# Patient Record
Sex: Male | Born: 1995 | Race: Black or African American | Hispanic: No | Marital: Single | Smoking: Never smoker
Health system: Southern US, Community
[De-identification: ages and names within clinical notes are randomized; demographics above are authoritative.]

## PROBLEM LIST (undated history)

## (undated) HISTORY — PX: ABDOMINAL SURGERY: SHX537

---
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
## 2015-07-22 ENCOUNTER — Encounter (HOSPITAL_BASED_OUTPATIENT_CLINIC_OR_DEPARTMENT_OTHER): Payer: Self-pay

## 2015-07-22 ENCOUNTER — Emergency Department (HOSPITAL_BASED_OUTPATIENT_CLINIC_OR_DEPARTMENT_OTHER)
Admission: EM | Admit: 2015-07-22 | Discharge: 2015-07-22 | Disposition: A | Discharge status: Home / Self Care | Payer: BLUE CROSS/BLUE SHIELD | Attending: Emergency Medicine | Admitting: Emergency Medicine

## 2015-07-22 DIAGNOSIS — R509 Fever, unspecified: Secondary | ICD-10-CM | POA: Diagnosis present

## 2015-07-22 DIAGNOSIS — J069 Acute upper respiratory infection, unspecified: Secondary | ICD-10-CM | POA: Diagnosis not present

## 2015-07-22 LAB — RAPID STREP SCREEN (MED CTR MEBANE ONLY): Streptococcus, Group A Screen (Direct): NEGATIVE

## 2015-07-22 MED ORDER — IBUPROFEN 600 MG PO TABS: 600.0000 mg | ORAL_TABLET | Freq: Four times a day (QID) | ORAL | Status: AC | PRN

## 2015-07-22 MED ORDER — ACETAMINOPHEN 325 MG PO TABS
650.0000 mg | ORAL_TABLET | Freq: Once | ORAL | Status: AC | PRN
  Administered 2015-07-22: 650 mg via ORAL
  Filled 2015-07-22: qty 2

## 2015-07-22 MED ORDER — KETOROLAC TROMETHAMINE 30 MG/ML IJ SOLN
30.0000 mg | Freq: Once | INTRAMUSCULAR | Status: AC
  Administered 2015-07-22: 30 mg via INTRAMUSCULAR
  Filled 2015-07-22: qty 1

## 2015-07-22 NOTE — ED Notes (Signed)
Pt reports headache, sore throat, back pain and fever that started today.

## 2015-07-22 NOTE — Discharge Instructions (Signed)
Upper Respiratory Infection, Adult Most upper respiratory infections (URIs) are a viral infection of the air passages leading to the lungs. A URI affects the nose, throat, and upper air passages. The most common type of URI is nasopharyngitis and is typically referred to as "the common cold." URIs run their course and usually go away on their own. Most of the time, a URI does not require medical attention, but sometimes a bacterial infection in the upper airways can follow a viral infection. This is called a secondary infection. Sinus and middle ear infections are common types of secondary upper respiratory infections. Bacterial pneumonia can also complicate a URI. A URI can worsen asthma and chronic obstructive pulmonary disease (COPD). Sometimes, these complications can require emergency medical care and may be life threatening.  CAUSES Almost all URIs are caused by viruses. A virus is a type of germ and can spread from one person to another.  RISKS FACTORS You may be at risk for a URI if:   You smoke.   You have chronic heart or lung disease.  You have a weakened defense (immune) system.   You are very young or very old.   You have nasal allergies or asthma.  You work in crowded or poorly ventilated areas.  You work in health care facilities or schools. SIGNS AND SYMPTOMS  Symptoms typically develop 2-3 days after you come in contact with a cold virus. Most viral URIs last 7-10 days. However, viral URIs from the influenza virus (flu virus) can last 14-18 days and are typically more severe. Symptoms may include:   Runny or stuffy (congested) nose.   Sneezing.   Cough.   Sore throat.   Headache.   Fatigue.   Fever.   Loss of appetite.   Pain in your forehead, behind your eyes, and over your cheekbones (sinus pain).  Muscle aches.  DIAGNOSIS  Your health care provider may diagnose a URI by:  Physical exam.  Tests to check that your symptoms are not due to  another condition such as:  Strep throat.  Sinusitis.  Pneumonia.  Asthma. TREATMENT  A URI goes away on its own with time. It cannot be cured with medicines, but medicines may be prescribed or recommended to relieve symptoms. Medicines may help:  Reduce your fever.  Reduce your cough.  Relieve nasal congestion. HOME CARE INSTRUCTIONS   Take medicines only as directed by your health care provider.   Gargle warm saltwater or take cough drops to comfort your throat as directed by your health care provider.  Use a warm mist humidifier or inhale steam from a shower to increase air moisture. This may make it easier to breathe.  Drink enough fluid to keep your urine clear or pale yellow.   Eat soups and other clear broths and maintain good nutrition.   Rest as needed.   Return to work when your temperature has returned to normal or as your health care provider advises. You may need to stay home longer to avoid infecting others. You can also use a face mask and careful hand washing to prevent spread of the virus.  Increase the usage of your inhaler if you have asthma.   Do not use any tobacco products, including cigarettes, chewing tobacco, or electronic cigarettes. If you need help quitting, ask your health care provider. PREVENTION  The best way to protect yourself from getting a cold is to practice good hygiene.   Avoid oral or hand contact with people with cold   symptoms.   Wash your hands often if contact occurs.  There is no clear evidence that vitamin C, vitamin E, echinacea, or exercise reduces the chance of developing a cold. However, it is always recommended to get plenty of rest, exercise, and practice good nutrition.  SEEK MEDICAL CARE IF:   You are getting worse rather than better.   Your symptoms are not controlled by medicine.   You have chills.  You have worsening shortness of breath.  You have brown or red mucus.  You have yellow or brown nasal  discharge.  You have pain in your face, especially when you bend forward.  You have a fever.  You have swollen neck glands.  You have pain while swallowing.  You have white areas in the back of your throat. SEEK IMMEDIATE MEDICAL CARE IF:   You have severe or persistent:  Headache.  Ear pain.  Sinus pain.  Chest pain.  You have chronic lung disease and any of the following:  Wheezing.  Prolonged cough.  Coughing up blood.  A change in your usual mucus.  You have a stiff neck.  You have changes in your:  Vision.  Hearing.  Thinking.  Mood. MAKE SURE YOU:   Understand these instructions.  Will watch your condition.  Will get help right away if you are not doing well or get worse.   This information is not intended to replace advice given to you by your health care provider. Make sure you discuss any questions you have with your health care provider.   Document Released: 08/20/2000 Document Revised: 07/11/2014 Document Reviewed: 06/01/2013 Elsevier Interactive Patient Education 2016 [employer]  

## 2015-07-22 NOTE — ED Provider Notes (Signed)
CSN: [id number]     Arrival date & time 07/22/15  0101 History   First MD Initiated Contact with Patient 07/22/15 0116     Chief Complaint  Patient presents with  . Fever     (Consider location/radiation/quality/duration/timing/severity/associated sxs/prior Treatment) HPI  This a 20 year old male with no significant past medical history who presents with sore throat, fever, myalgias. Patient states onset of symptoms earlier today. Fever up to 101.7. Did not improve with Tylenol or Motrin but his grandmother states that she is not sure that she gave him enough. Denies any nausea, vomiting or shortness of breath, diarrhea. Does endorse a dry cough. Just recently graduated from college and has been traveling and dizzy interviewing. Multiple sick contacts. Endorses headache. No neck pain or stiffness.  History reviewed. No pertinent past medical history. Past Surgical History  Procedure Laterality Date  . Abdominal surgery     No family history on file. Social History  Substance Use Topics  . Smoking status: Never Smoker   . Smokeless tobacco: None  . Alcohol Use: Yes    Review of Systems  Constitutional: Positive for fever.  HENT: Positive for sore throat. Negative for trouble swallowing.   Respiratory: Positive for cough. Negative for shortness of breath.   Cardiovascular: Negative for chest pain.  Gastrointestinal: Negative for nausea, vomiting and abdominal pain.  Musculoskeletal: Negative for neck pain and neck stiffness.  Skin: Negative for rash.  All other systems reviewed and are negative.     Allergies  Latex  Home Medications   Prior to Admission medications   Medication Sig Start Date End Date Taking? Authorizing Provider  ibuprofen (ADVIL,MOTRIN) 600 MG tablet Take 1 tablet (600 mg total) by mouth every 6 (six) hours as needed. 07/22/15   Shon Batonourtney F Clarita Mcelvain, MD   BP 118/69 mmHg  Pulse 97  Temp(Src) 101.6 F (38.7 C) (Oral)  Resp 18  Ht 6' (1.829 m)  Wt 215  lb (97.523 kg)  BMI 29.15 kg/m2  SpO2 97% Physical Exam  Constitutional: He is oriented to person, place, and time. He appears well-developed and well-nourished.  HENT:  Head: Normocephalic and atraumatic.  Erythematous posterior oropharynx, no tonsillar enlargement or tonsillar exudate, uvula midline  Neck: Normal range of motion. Neck supple.  No meningismus  Cardiovascular: Normal rate, regular rhythm and normal heart sounds.   No murmur heard. Pulmonary/Chest: Effort normal and breath sounds normal. No respiratory distress. He has no wheezes.  Abdominal: Soft. Bowel sounds are normal. There is no tenderness. There is no rebound.  Musculoskeletal: He exhibits no edema.  Lymphadenopathy:    He has cervical adenopathy.  Neurological: He is alert and oriented to person, place, and time.  Skin: Skin is warm and dry.  Psychiatric: He has a normal mood and affect.  Nursing note and vitals reviewed.   ED Course  Procedures (including critical care time) Labs Review Labs Reviewed  RAPID ST[facility]CREEN [facility])  CULTURE, GROUP A STREP [facility])    Imaging Review No results found. I have personally reviewed and evaluated these images and lab results as part of my medical decision-making.   EKG Interpretation None      MDM   Final diagnoses:  URI (upper respiratory infection)  Other specified fever    Patient presents with fever, cough, sore throat, and myalgias. Febrile to 101.6 but otherwise nontoxic. Physical exam is largely reassuring. Clear breath sounds. Oropharynx without exudate. Ongoing for one day. Patient was given Tylenol for fever. Strep  screen is negative. 2/4 [facility] criteria.  Suspect viral etiology.  Discussed with the patient supportive measures at home including ibuprofen for pain and fever and the importance of hydration. Patient stated understanding. He was given strict return precautions.  After history, exam, and medical workup I feel the patient has  been appropriately medically screened and is safe for discharge home. Pertinent diagnoses were discussed with the patient. Patient was given return precautions.     Shon Baton, MD 07/22/15 [phone]

## 2015-07-24 LAB — CULTURE, GROUP A STREP (THRC)
